# Patient Record
Sex: Male | Born: 1990 | Race: Black or African American | Hispanic: No | Marital: Single | Smoking: Never smoker
Health system: Southern US, Community
[De-identification: ages and names within clinical notes are randomized; demographics above are authoritative.]

---
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
## 2010-05-08 ENCOUNTER — Emergency Department (HOSPITAL_COMMUNITY)
Admission: EM | Admit: 2010-05-08 | Discharge: 2010-05-08 | Payer: Self-pay | Source: Home / Self Care | Admitting: Emergency Medicine

## 2010-09-28 ENCOUNTER — Emergency Department (HOSPITAL_COMMUNITY)
Admission: EM | Admit: 2010-09-28 | Discharge: 2010-09-28 | Disposition: A | Discharge status: Home / Self Care | Payer: No Typology Code available for payment source | Attending: Emergency Medicine | Admitting: Emergency Medicine

## 2010-09-28 DIAGNOSIS — M545 Low back pain, unspecified: Secondary | ICD-10-CM | POA: Insufficient documentation

## 2010-09-28 DIAGNOSIS — S335XXA Sprain of ligaments of lumbar spine, initial encounter: Secondary | ICD-10-CM | POA: Insufficient documentation

## 2010-09-28 DIAGNOSIS — R51 Headache: Secondary | ICD-10-CM | POA: Insufficient documentation

## 2011-10-20 ENCOUNTER — Encounter (HOSPITAL_COMMUNITY): Payer: Self-pay

## 2011-10-20 ENCOUNTER — Emergency Department (HOSPITAL_COMMUNITY): Payer: No Typology Code available for payment source

## 2011-10-20 ENCOUNTER — Emergency Department (HOSPITAL_COMMUNITY)
Admission: EM | Admit: 2011-10-20 | Discharge: 2011-10-20 | Disposition: A | Discharge status: Home / Self Care | Payer: No Typology Code available for payment source | Attending: Emergency Medicine | Admitting: Emergency Medicine

## 2011-10-20 DIAGNOSIS — M545 Low back pain, unspecified: Secondary | ICD-10-CM | POA: Insufficient documentation

## 2011-10-20 DIAGNOSIS — S39012A Strain of muscle, fascia and tendon of lower back, initial encounter: Secondary | ICD-10-CM

## 2011-10-20 DIAGNOSIS — S335XXA Sprain of ligaments of lumbar spine, initial encounter: Secondary | ICD-10-CM | POA: Insufficient documentation

## 2011-10-20 MED ORDER — HYDROCODONE-ACETAMINOPHEN 5-325 MG PO TABS: 1.0000 | ORAL_TABLET | ORAL | Status: AC | PRN

## 2011-10-20 MED ORDER — HYDROCODONE-ACETAMINOPHEN 5-325 MG PO TABS
1.0000 | ORAL_TABLET | Freq: Once | ORAL | Status: AC
  Administered 2011-10-20: 1 via ORAL
  Filled 2011-10-20: qty 1

## 2011-10-20 MED ORDER — CYCLOBENZAPRINE HCL 10 MG PO TABS: 10.0000 mg | ORAL_TABLET | Freq: Two times a day (BID) | ORAL | Status: AC | PRN

## 2011-10-20 NOTE — ED Provider Notes (Signed)
Medical screening examination/treatment/procedure(s) were performed by non-physician practitioner and as supervising physician I was immediately available for consultation/collaboration.  Juliet Rude. Rubin Payor, MD 10/20/11 2325
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
## 2011-10-20 NOTE — Discharge Instructions (Signed)
Lumbosacral Strain Lumbosacral strain is one of the most common causes of back pain. There are many causes of back pain. Most are not serious conditions. CAUSES  Your backbone (spinal column) is made up of 24 main vertebral bodies, the sacrum, and the coccyx. These are held together by muscles and tough, fibrous tissue (ligaments). Nerve roots pass through the openings between the vertebrae. A sudden move or injury to the back may cause injury to, or pressure on, these nerves. This may result in localized back pain or pain movement (radiation) into the buttocks, down the leg, and into the foot. Sharp, shooting pain from the buttock down the back of the leg (sciatica) is frequently associated with a ruptured (herniated) disk. Pain may be caused by muscle spasm alone. Your caregiver can often find the cause of your pain by the details of your symptoms and an exam. In some cases, you may need tests (such as X-rays). Your caregiver will work with you to decide if any tests are needed based on your specific exam. HOME CARE INSTRUCTIONS   Avoid an underactive lifestyle. Active exercise, as directed by your caregiver, is your greatest weapon against back pain.   Avoid hard physical activities (tennis, racquetball, waterskiing) if you are not in proper physical condition for it. This may aggravate or create problems.   If you have a back problem, avoid sports requiring sudden body movements. Swimming and walking are generally safer activities.   Maintain good posture.   Avoid becoming overweight (obese).   Use bed rest for only the most extreme, sudden (acute) episode. Your caregiver will help you determine how much bed rest is necessary.   For acute conditions, you may put ice on the injured area.   Put ice in a plastic bag.   Place a towel between your skin and the bag.   Leave the ice on for 15 to 20 minutes at a time, every 2 hours, or as needed.   After you are improved and more active, it  may help to apply heat for 30 minutes before activities.  See your caregiver if you are having pain that lasts longer than expected. Your caregiver can advise appropriate exercises or therapy if needed. With conditioning, most back problems can be avoided. SEEK IMMEDIATE MEDICAL CARE IF:   You have numbness, tingling, weakness, or problems with the use of your arms or legs.   You experience severe back pain not relieved with medicines.   There is a change in bowel or bladder control.   You have increasing pain in any area of the body, including your belly (abdomen).   You notice shortness of breath, dizziness, or feel faint.   You feel sick to your stomach (nauseous), are throwing up (vomiting), or become sweaty.   You notice discoloration of your toes or legs, or your feet get very cold.   Your back pain is getting worse.   You have a fever.  MAKE SURE YOU:   Understand these instructions.   Will watch your condition.   Will get help right away if you are not doing well or get worse.  Document Released: 02/25/2005 Document Revised: 05/07/2011 Document Reviewed: 08/17/2008 [facility] Patient Information 2012 [place], [place].Muscle Strain A muscle strain (pulled muscle) happens when a muscle is over-stretched. Recovery usually takes 5 to 6 weeks.  HOME CARE   Put ice on the injured area.   Put ice in a plastic bag.   Place a towel between your skin and  the bag.   Leave the ice on for 15 to 20 minutes at a time, every hour for the first 2 days.   Do not use the muscle for several days or until your doctor says you can. Do not use the muscle if you have pain.   Wrap the injured area with an elastic bandage for comfort. Do not put it on too tightly.   Only take medicine as told by your doctor.   Warm up before exercise. This helps prevent muscle strains.  GET HELP RIGHT AWAY IF:  There is increased pain or puffiness (swelling) in the affected area. MAKE SURE YOU:    Understand these instructions.   Will watch your condition.   Will get help right away if you are not doing well or get worse.  Document Released: 02/25/2008 Document Revised: 05/07/2011 Document Reviewed: 02/25/2008 [facility] Patient Information 2012 [place], [place].
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
## 2014-03-19 ENCOUNTER — Emergency Department (HOSPITAL_COMMUNITY)
Admission: EM | Admit: 2014-03-19 | Discharge: 2014-03-19 | Disposition: A | Discharge status: Home / Self Care | Payer: No Typology Code available for payment source | Attending: Emergency Medicine | Admitting: Emergency Medicine

## 2014-03-19 ENCOUNTER — Encounter (HOSPITAL_COMMUNITY): Payer: Self-pay | Admitting: Emergency Medicine

## 2014-03-19 DIAGNOSIS — Y9389 Activity, other specified: Secondary | ICD-10-CM | POA: Insufficient documentation

## 2014-03-19 DIAGNOSIS — Y92410 Unspecified street and highway as the place of occurrence of the external cause: Secondary | ICD-10-CM | POA: Insufficient documentation

## 2014-03-19 DIAGNOSIS — G44209 Tension-type headache, unspecified, not intractable: Secondary | ICD-10-CM

## 2014-03-19 DIAGNOSIS — S0990XA Unspecified injury of head, initial encounter: Secondary | ICD-10-CM | POA: Insufficient documentation

## 2014-03-19 DIAGNOSIS — S161XXA Strain of muscle, fascia and tendon at neck level, initial encounter: Secondary | ICD-10-CM

## 2014-03-19 DIAGNOSIS — S39012A Strain of muscle, fascia and tendon of lower back, initial encounter: Secondary | ICD-10-CM | POA: Insufficient documentation

## 2014-03-19 MED ORDER — DIAZEPAM 5 MG PO TABS: 5.0000 mg | ORAL_TABLET | Freq: Two times a day (BID) | ORAL | Status: DC

## 2014-03-19 MED ORDER — NAPROXEN 500 MG PO TABS
500.0000 mg | ORAL_TABLET | Freq: Once | ORAL | Status: AC
  Administered 2014-03-19: 500 mg via ORAL
  Filled 2014-03-19: qty 1

## 2014-03-19 MED ORDER — NAPROXEN 500 MG PO TABS
500.0000 mg | ORAL_TABLET | Freq: Two times a day (BID) | ORAL | Status: DC
Start: 2014-03-19 — End: 2020-05-25

## 2014-03-19 NOTE — Discharge Instructions (Signed)
Muscle Strain °A muscle strain is an injury that occurs when a muscle is stretched beyond its normal length. Usually a small number of muscle fibers are torn when this happens. Muscle strain is rated in degrees. First-degree strains have the least amount of muscle fiber tearing and pain. Second-degree and third-degree strains have increasingly more tearing and pain.  °Usually, recovery from muscle strain takes 1-2 weeks. Complete healing takes 5-6 weeks.  °CAUSES  °Muscle strain happens when a sudden, violent force placed on a muscle stretches it too far. This may occur with lifting, sports, or a fall.  °RISK FACTORS °Muscle strain is especially common in athletes.  °SIGNS AND SYMPTOMS °At the site of the muscle strain, there may be: °· Pain. °· Bruising. °· Swelling. °· Difficulty using the muscle due to pain or lack of normal function. °DIAGNOSIS  °Your health care provider will perform a physical exam and ask about your medical history. °TREATMENT  °Often, the best treatment for a muscle strain is resting, icing, and applying cold compresses to the injured area.   °HOME CARE INSTRUCTIONS  °· Use the PRICE method of treatment to promote muscle healing during the first 2-3 days after your injury. The PRICE method involves: °· Protecting the muscle from being injured again. °· Restricting your activity and resting the injured body part. °· Icing your injury. To do this, put ice in a plastic bag. Place a towel between your skin and the bag. Then, apply the ice and leave it on from 15-20 minutes each hour. After the third day, switch to moist heat packs. °· Apply compression to the injured area with a splint or elastic bandage. Be careful not to wrap it too tightly. This may interfere with blood circulation or increase swelling. °· Elevate the injured body part above the level of your heart as often as you can. °· Only take over-the-counter or prescription medicines for pain, discomfort, or fever as directed by your  health care provider. °· Warming up prior to exercise helps to prevent future muscle strains. °SEEK MEDICAL CARE IF:  °· You have increasing pain or swelling in the injured area. °· You have numbness, tingling, or a significant loss of strength in the injured area. °MAKE SURE YOU:  °· Understand these instructions. °· Will watch your condition. °· Will get help right away if you are not doing well or get worse. °Document Released: 05/18/2005 Document Revised: 03/08/2013 Document Reviewed: 12/15/2012 °[employer]® Patient Information ©2015 [employer]. This information is not intended to replace advice given to you by your health care provider. Make sure you discuss any questions you have with your health care provider. ° °Motor Vehicle Collision °It is common to have multiple bruises and sore muscles after a motor vehicle collision (MVC). These tend to feel worse for the first 24 hours. You may have the most stiffness and soreness over the first several hours. You may also feel worse when you wake up the first morning after your collision. After this point, you will usually begin to improve with each day. The speed of improvement often depends on the severity of the collision, the number of injuries, and the location and nature of these injuries. °HOME CARE INSTRUCTIONS °· Put ice on the injured area. °¨ Put ice in a plastic bag. °¨ Place a towel between your skin and the bag. °¨ Leave the ice on for 15-20 minutes, 3-4 times a day, or as directed by your health care provider. °· Drink enough fluids to   keep your urine clear or pale yellow. Do not drink alcohol. °· Take a warm shower or bath once or twice a day. This will increase blood flow to sore muscles. °· You may return to activities as directed by your caregiver. Be careful when lifting, as this may aggravate neck or back pain. °· Only take over-the-counter or prescription medicines for pain, discomfort, or fever as directed by your caregiver. Do not use  aspirin. This may increase bruising and bleeding. °SEEK IMMEDIATE MEDICAL CARE IF: °· You have numbness, tingling, or weakness in the arms or legs. °· You develop severe headaches not relieved with medicine. °· You have severe neck pain, especially tenderness in the middle of the back of your neck. °· You have changes in bowel or bladder control. °· There is increasing pain in any area of the body. °· You have shortness of breath, light-headedness, dizziness, or fainting. °· You have chest pain. °· You feel sick to your stomach (nauseous), throw up (vomit), or sweat. °· You have increasing abdominal discomfort. °· There is blood in your urine, stool, or vomit. °· You have pain in your shoulder (shoulder strap areas). °· You feel your symptoms are getting worse. °MAKE SURE YOU: °· Understand these instructions. °· Will watch your condition. °· Will get help right away if you are not doing well or get worse. °Document Released: 05/18/2005 Document Revised: 10/02/2013 Document Reviewed: 10/15/2010 °[employer]® Patient Information ©2015 [employer]. This information is not intended to replace advice given to you by your health care provider. Make sure you discuss any questions you have with your health care provider. ° °
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
## 2014-03-19 NOTE — ED Provider Notes (Signed)
Medical screening examination/treatment/procedure(s) were performed by non-physician practitioner and as supervising physician I was immediately available for consultation/collaboration.  Tache Bobst T Infant Doane, MD 03/19/14 2353 

## 2014-09-09 ENCOUNTER — Encounter (HOSPITAL_COMMUNITY): Payer: Self-pay | Admitting: Emergency Medicine

## 2014-09-09 ENCOUNTER — Emergency Department (HOSPITAL_COMMUNITY)
Admission: EM | Admit: 2014-09-09 | Discharge: 2014-09-09 | Disposition: A | Discharge status: Home / Self Care | Payer: Self-pay | Attending: Emergency Medicine | Admitting: Emergency Medicine

## 2014-09-09 DIAGNOSIS — Z791 Long term (current) use of non-steroidal anti-inflammatories (NSAID): Secondary | ICD-10-CM | POA: Insufficient documentation

## 2014-09-09 DIAGNOSIS — S0990XA Unspecified injury of head, initial encounter: Secondary | ICD-10-CM | POA: Insufficient documentation

## 2014-09-09 DIAGNOSIS — Y92410 Unspecified street and highway as the place of occurrence of the external cause: Secondary | ICD-10-CM | POA: Insufficient documentation

## 2014-09-09 DIAGNOSIS — Y998 Other external cause status: Secondary | ICD-10-CM | POA: Insufficient documentation

## 2014-09-09 DIAGNOSIS — R51 Headache: Secondary | ICD-10-CM

## 2014-09-09 DIAGNOSIS — Y9389 Activity, other specified: Secondary | ICD-10-CM | POA: Insufficient documentation

## 2014-09-09 DIAGNOSIS — Z79899 Other long term (current) drug therapy: Secondary | ICD-10-CM | POA: Insufficient documentation

## 2014-09-09 DIAGNOSIS — R519 Headache, unspecified: Secondary | ICD-10-CM

## 2014-09-09 MED ORDER — CYCLOBENZAPRINE HCL 10 MG PO TABS
10.0000 mg | ORAL_TABLET | Freq: Once | ORAL | Status: AC
  Administered 2014-09-09: 10 mg via ORAL
  Filled 2014-09-09: qty 1

## 2014-09-09 MED ORDER — TRAMADOL HCL 50 MG PO TABS
50.0000 mg | ORAL_TABLET | Freq: Once | ORAL | Status: AC
  Administered 2014-09-09: 50 mg via ORAL
  Filled 2014-09-09: qty 1

## 2014-09-09 MED ORDER — TRAMADOL HCL 50 MG PO TABS: 50.0000 mg | ORAL_TABLET | Freq: Four times a day (QID) | ORAL | Status: DC | PRN

## 2014-09-09 MED ORDER — CYCLOBENZAPRINE HCL 10 MG PO TABS: 10.0000 mg | ORAL_TABLET | Freq: Two times a day (BID) | ORAL | Status: DC | PRN
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
## 2014-09-09 NOTE — Discharge Instructions (Signed)
Take Tramadol as needed for pain. Take Flexeril as needed for muscle spasm. Refer to attached documents for more information.  °

## 2020-05-25 ENCOUNTER — Emergency Department (HOSPITAL_COMMUNITY)
Admission: EM | Admit: 2020-05-25 | Discharge: 2020-05-25 | Disposition: A | Discharge status: Home / Self Care | Payer: Self-pay | Attending: Emergency Medicine | Admitting: Emergency Medicine

## 2020-05-25 ENCOUNTER — Encounter (HOSPITAL_COMMUNITY): Payer: Self-pay

## 2020-05-25 ENCOUNTER — Other Ambulatory Visit: Payer: Self-pay

## 2020-05-25 DIAGNOSIS — Z20822 Contact with and (suspected) exposure to covid-19: Secondary | ICD-10-CM | POA: Insufficient documentation

## 2020-05-25 DIAGNOSIS — M5441 Lumbago with sciatica, right side: Secondary | ICD-10-CM | POA: Insufficient documentation

## 2020-05-25 DIAGNOSIS — J029 Acute pharyngitis, unspecified: Secondary | ICD-10-CM | POA: Insufficient documentation

## 2020-05-25 LAB — URINALYSIS, ROUTINE W REFLEX MICROSCOPIC
Bacteria, UA: NONE SEEN
Bilirubin Urine: NEGATIVE
Glucose, UA: NEGATIVE mg/dL
Ketones, ur: NEGATIVE mg/dL
Leukocytes,Ua: NEGATIVE
Nitrite: NEGATIVE
Protein, ur: 30 mg/dL — AB
Specific Gravity, Urine: 1.014 (ref 1.005–1.030)
pH: 5 (ref 5.0–8.0)

## 2020-05-25 LAB — POC SARS CORONAVIRUS 2 AG -  ED: SARS Coronavirus 2 Ag: NEGATIVE

## 2020-05-25 LAB — GROUP A STREP BY PCR: Group A Strep by PCR: NOT DETECTED

## 2020-05-25 MED ORDER — ACETAMINOPHEN 500 MG PO TABS
1000.0000 mg | ORAL_TABLET | Freq: Once | ORAL | Status: AC
  Administered 2020-05-25: 1000 mg via ORAL
  Filled 2020-05-25: qty 2

## 2020-05-25 MED ORDER — KETOROLAC TROMETHAMINE 15 MG/ML IJ SOLN
15.0000 mg | Freq: Once | INTRAMUSCULAR | Status: AC
  Administered 2020-05-25: 15 mg via INTRAMUSCULAR
  Filled 2020-05-25: qty 1

## 2020-05-25 MED ORDER — CYCLOBENZAPRINE HCL 10 MG PO TABS: 10.0000 mg | ORAL_TABLET | Freq: Two times a day (BID) | ORAL | 0 refills | Status: AC | PRN

## 2020-05-25 MED ORDER — PREDNISONE 20 MG PO TABS: 40.0000 mg | ORAL_TABLET | Freq: Every day | ORAL | 0 refills | Status: AC

## 2020-05-25 MED ORDER — DIAZEPAM 5 MG PO TABS
5.0000 mg | ORAL_TABLET | Freq: Once | ORAL | Status: AC
  Administered 2020-05-25: 5 mg via ORAL
  Filled 2020-05-25: qty 1

## 2020-05-25 MED ORDER — OXYCODONE HCL 5 MG PO TABS
5.0000 mg | ORAL_TABLET | Freq: Once | ORAL | Status: AC
  Administered 2020-05-25: 5 mg via ORAL
  Filled 2020-05-25: qty 1

## 2020-05-25 NOTE — ED Triage Notes (Signed)
Pt reports 3 days of right sided lower back pain that sometimes radiates down his leg, pain is worse when he is sitting for a long period of time. Pt also reports a sore throat, hx of tonsillitis. Denies fever but endorses intermittent chills.

## 2020-05-25 NOTE — Discharge Instructions (Addendum)
Your Covid 19 test today was negative.  You may continue to treat your fever at home with Tylenol.  I have prescribed a short course of muscle relaxers to help with your back spasms.  I have also prescribed a short course of steroids to help with your pain.  Please be aware this medication can cause flushness, changes in appetite, insomnia.  Please be aware muscle relaxers can also cause you to be drowsy, do not drive while taking this medication.
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
## 2022-11-28 ENCOUNTER — Emergency Department (HOSPITAL_COMMUNITY)
Admission: EM | Admit: 2022-11-28 | Discharge: 2022-11-28 | Disposition: A | Discharge status: Home / Self Care | Payer: Self-pay | Attending: Emergency Medicine | Admitting: Emergency Medicine

## 2022-11-28 ENCOUNTER — Other Ambulatory Visit: Payer: Self-pay

## 2022-11-28 DIAGNOSIS — R519 Headache, unspecified: Secondary | ICD-10-CM | POA: Insufficient documentation

## 2022-11-28 DIAGNOSIS — Y92410 Unspecified street and highway as the place of occurrence of the external cause: Secondary | ICD-10-CM | POA: Insufficient documentation

## 2022-11-28 DIAGNOSIS — M549 Dorsalgia, unspecified: Secondary | ICD-10-CM | POA: Diagnosis present

## 2022-11-28 DIAGNOSIS — M546 Pain in thoracic spine: Secondary | ICD-10-CM | POA: Diagnosis not present

## 2022-11-28 DIAGNOSIS — S39012A Strain of muscle, fascia and tendon of lower back, initial encounter: Secondary | ICD-10-CM | POA: Insufficient documentation

## 2022-11-28 MED ORDER — ETODOLAC 400 MG PO TABS: 400.0000 mg | ORAL_TABLET | Freq: Two times a day (BID) | ORAL | 0 refills | Status: DC

## 2022-11-28 MED ORDER — METHOCARBAMOL 500 MG PO TABS: 500.0000 mg | ORAL_TABLET | Freq: Two times a day (BID) | ORAL | 0 refills | Status: AC

## 2022-11-28 MED ORDER — ETODOLAC 400 MG PO TABS: 400.0000 mg | ORAL_TABLET | Freq: Two times a day (BID) | ORAL | 0 refills | Status: AC

## 2022-11-28 MED ORDER — LIDOCAINE 5 % EX PTCH: 1.0000 | MEDICATED_PATCH | CUTANEOUS | 0 refills | Status: DC

## 2022-11-28 MED ORDER — METHOCARBAMOL 500 MG PO TABS: 500.0000 mg | ORAL_TABLET | Freq: Two times a day (BID) | ORAL | 0 refills | Status: DC

## 2022-11-28 MED ORDER — LIDOCAINE 5 % EX PTCH: 1.0000 | MEDICATED_PATCH | CUTANEOUS | 0 refills | Status: AC
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
## 2022-11-28 NOTE — Discharge Instructions (Addendum)
Your exam today is reassuring.  Your back pain is likely associated with muscle strain.  I have sent a few medications into the pharmacy for you.  If you have any concerning symptoms return to the emergency room for evaluation.  Otherwise follow-up with your primary care provider.

## 2023-12-30 ENCOUNTER — Encounter (HOSPITAL_COMMUNITY): Payer: Self-pay | Admitting: Emergency Medicine

## 2023-12-30 ENCOUNTER — Other Ambulatory Visit: Payer: Self-pay

## 2023-12-30 ENCOUNTER — Emergency Department (HOSPITAL_COMMUNITY)
Admission: EM | Admit: 2023-12-30 | Discharge: 2023-12-31 | Disposition: A | Discharge status: Home / Self Care | Attending: Student | Admitting: Student

## 2023-12-30 DIAGNOSIS — D72829 Elevated white blood cell count, unspecified: Secondary | ICD-10-CM | POA: Insufficient documentation

## 2023-12-30 DIAGNOSIS — R42 Dizziness and giddiness: Secondary | ICD-10-CM | POA: Diagnosis not present

## 2023-12-30 DIAGNOSIS — R1084 Generalized abdominal pain: Secondary | ICD-10-CM | POA: Diagnosis not present

## 2023-12-30 DIAGNOSIS — R112 Nausea with vomiting, unspecified: Secondary | ICD-10-CM | POA: Diagnosis present

## 2023-12-30 LAB — LIPASE, BLOOD: Lipase: 44 U/L (ref 11–51)

## 2023-12-30 LAB — CBC
HCT: 46.4 % (ref 39.0–52.0)
Hemoglobin: 15.4 g/dL (ref 13.0–17.0)
MCH: 30.1 pg (ref 26.0–34.0)
MCHC: 33.2 g/dL (ref 30.0–36.0)
MCV: 90.8 fL (ref 80.0–100.0)
Platelets: 345 K/uL (ref 150–400)
RBC: 5.11 MIL/uL (ref 4.22–5.81)
RDW: 12.9 % (ref 11.5–15.5)
WBC: 14.3 K/uL — ABNORMAL HIGH (ref 4.0–10.5)
nRBC: 0 % (ref 0.0–0.2)

## 2023-12-30 LAB — COMPREHENSIVE METABOLIC PANEL WITH GFR
ALT: 41 U/L (ref 0–44)
AST: 23 U/L (ref 15–41)
Albumin: 4.9 g/dL (ref 3.5–5.0)
Alkaline Phosphatase: 90 U/L (ref 38–126)
Anion gap: 13 (ref 5–15)
BUN: 12 mg/dL (ref 6–20)
CO2: 22 mmol/L (ref 22–32)
Calcium: 10.4 mg/dL — ABNORMAL HIGH (ref 8.9–10.3)
Chloride: 105 mmol/L (ref 98–111)
Creatinine, Ser: 1.05 mg/dL (ref 0.61–1.24)
GFR, Estimated: >60 mL/min (ref >60)
Glucose, Bld: 137 mg/dL — ABNORMAL HIGH (ref 70–99)
Potassium: 3.7 mmol/L (ref 3.5–5.1)
Sodium: 140 mmol/L (ref 135–145)
Total Bilirubin: 1.2 mg/dL (ref 0.0–1.2)
Total Protein: 8.9 g/dL — ABNORMAL HIGH (ref 6.5–8.1)

## 2023-12-30 MED ORDER — MORPHINE SULFATE (PF) 4 MG/ML IV SOLN
4.0000 mg | Freq: Once | INTRAVENOUS | Status: AC
  Administered 2023-12-30: 4 mg via INTRAVENOUS
  Filled 2023-12-30: qty 1

## 2023-12-30 MED ORDER — ONDANSETRON HCL 4 MG/2ML IJ SOLN
4.0000 mg | Freq: Once | INTRAMUSCULAR | Status: AC
Start: 2023-12-30 — End: 2023-12-30
  Administered 2023-12-30: 4 mg via INTRAVENOUS
  Filled 2023-12-30: qty 2

## 2023-12-30 MED ORDER — SODIUM CHLORIDE 0.9 % IV BOLUS
1000.0000 mL | Freq: Once | INTRAVENOUS | Status: AC
  Administered 2023-12-30: 1000 mL via INTRAVENOUS

## 2023-12-30 NOTE — ED Triage Notes (Signed)
 Pt reports abd pain, n/v since this am, pt reports he is on wegovy x 3 mos, last dose change was 1 mo ago

## 2023-12-30 NOTE — ED Provider Notes (Signed)
 [place] EMERGENCY DEPARTMENT AT [facility] Provider Note   CSN: [id number] Arrival date & time: 12/30/23  8063     Patient presents with: Abdominal Pain   Noah Navarro is a 33 y.o. male.   The history is provided by the patient and medical records. No language interpreter was used.  Abdominal Pain    33 year old male presenting with complaints of abdominal pain.  Patient states's throughout the day today he is experiencing abdominal cramping with severe nausea and vomiting which makes him feel very uncomfortable.  He felt lightheadedness as well.  He states he ate [employer] yesterday and unsure if that may have contributed to his condition.  He also admits to using semaglutide injection for weight loss and having using it for the past 3 months.  He is scheduled to have his dose increased tomorrow.  Patient also admits to regular daily marijuana use.  He denies alcohol abuse denies any chest pain or shortness of breath.  Last bowel movement was earlier today.  No urinary complaint.  Prior to Admission medications   Medication Sig Start Date End Date Taking? Authorizing Provider  Chlorphen-Phenyleph-Ibuprofen (ADVIL MULTI-SYMPTOM COLD & FLU PO) Take 1 tablet by mouth every 4 (four) hours as needed (cough/congestion/fever).    [provider]  Chlorphen-Pseudoephed-APAP (THERAFLU FLU/COLD PO) Take 1 packet by mouth every 4 (four) hours as needed (cough/congestion/fever).    [provider]  etodolac  (LODINE ) 400 MG tablet Take 1 tablet (400 mg total) by mouth 2 (two) times daily. 11/28/22   Hildegard Loge, PA-C  guaifenesin (ROBITUSSIN) 100 MG/5ML syrup Take 200 mg by mouth 3 (three) times daily as needed for cough.    [provider]  lidocaine  (LIDODERM ) 5 % Place 1 patch onto the skin daily. Remove & Discard patch within 12 hours or as directed by MD 11/28/22   Hildegard Loge, PA-C  methocarbamol  (ROBAXIN ) 500 MG tablet Take 1 tablet (500 mg  total) by mouth 2 (two) times daily. 11/28/22   Hildegard Loge, PA-C    Allergies: Patient has no known allergies.    Review of Systems  Gastrointestinal:  Positive for abdominal pain.  All other systems reviewed and are negative.   Updated Vital Signs BP 131/82 (BP Location: Left Arm)   Pulse 96   Temp 98.3 F (36.8 C) (Oral)   Resp 17   Ht 6' 2 (1.88 m)   Wt 122.5 kg   SpO2 99%   BMI 34.67 kg/m   Physical Exam Vitals and nursing note reviewed.  Constitutional:      General: He is not in acute distress.    Appearance: He is well-developed. He is obese.  HENT:     Head: Atraumatic.  Eyes:     Conjunctiva/sclera: Conjunctivae normal.  Cardiovascular:     Rate and Rhythm: Normal rate and regular rhythm.  Pulmonary:     Effort: Pulmonary effort is normal.     Breath sounds: Normal breath sounds.  Abdominal:     General: Bowel sounds are normal.     Palpations: Abdomen is soft.     Tenderness: There is generalized abdominal tenderness.     Hernia: No hernia is present.  Musculoskeletal:     Cervical back: Neck supple.  Skin:    Findings: No rash.  Neurological:     Mental Status: He is alert.     (all labs ordered are listed, but only abnormal results are displayed) Labs Reviewed  COMPREHENSIVE METABOLIC PANEL  WITH GFR - Abnormal; Notable for the following components:      Result Value   Glucose, Bld 137 (*)    Calcium 10.4 (*)    Total Protein 8.9 (*)    All other components within normal limits  CBC - Abnormal; Notable for the following components:   WBC 14.3 (*)    All other components within normal limits  LIPASE, BLOOD  URINALYSIS, ROUTINE W REFLEX MICROSCOPIC    EKG: None ED ECG REPORT   Date: 12/30/2023  Rate: 58  Rhythm: normal sinus rhythm  QRS Axis: left  Intervals: normal  ST/T Wave abnormalities: normal  Conduction Disutrbances:none  Narrative Interpretation:   Old EKG Reviewed: unchanged  I have personally reviewed the EKG tracing  and agree with the computerized printout as noted.   Radiology: No results found.   Procedures   Medications Ordered in the ED - No data to display  Clinical Course as of 12/30/23 2354  Thu Dec 30, 2023  2345 Ab pain, N/V. Pain began today. CT scan for leukocytosis. No Hx of CHS. Smokes daily. CT scan if normal, dc home. No mounjaro dose increases. No fever, some loose stools. F/u on CT, control symptoms, dispo [CG]    Clinical Course User Index [CG] Ruthell Lonni FALCON, PA-C                                 Medical Decision Making Amount and/or Complexity of Data Reviewed Labs: ordered. Radiology: ordered. ECG/medicine tests: ordered.  Risk Prescription drug management.   BP 131/82 (BP Location: Left Arm)   Pulse 96   Temp 98.3 F (36.8 C) (Oral)   Resp 17   Ht 6' 2 (1.88 m)   Wt 122.5 kg   SpO2 99%   BMI 34.67 kg/m   24:62 PM  33 year old male presenting with complaints of abdominal pain.  Patient states's throughout the day today he is experiencing abdominal cramping with severe nausea and vomiting which makes him feel very uncomfortable.  He felt lightheadedness as well.  He states he ate [employer] yesterday and unsure if that may have contributed to his condition.  He also admits to using semaglutide injection for weight loss and having using it for the past 3 months.  He is scheduled to have his dose increased tomorrow.  Patient also admits to regular daily marijuana use.  He denies alcohol abuse denies any chest pain or shortness of breath.  Last bowel movement was earlier today.  No urinary complaint.  On exam patient is resting comfortably appears to be in no acute discomfort.  He has some generalized abdominal tenderness without focal point tenderness.  Has normal skin turgor, heart with normal rate and rhythm, lungs are clear.  Bowel sounds are present.  -Labs ordered, independently viewed and interpreted by me.  Labs remarkable for WBC 14.3 -The  patient was maintained on a cardiac monitor.  I personally viewed and interpreted the cardiac monitored which showed an underlying rhythm of: sinus brady -Imaging including abd/pelvis CT scan is currently pending.  -This patient presents to the ED for concern of abd pain, this involves an extensive number of treatment options, and is a complaint that carries with it a high risk of complications and morbidity.  The differential diagnosis includes CHS, gastritis, GERD, appendicitis, colitis, pancreatitis, cholecystitis -Co morbidities that complicate the patient evaluation includes marijuana use -Treatment includes morphine , zofran , IVF -Reevaluation of  the patient after these medicines showed that the patient improved -PCP office notes or outside notes reviewed -Discussion with oncoming team who will f/u on CT result and determine disposition -Escalation to admission/observation considered: dispo pending      Final diagnoses:  None    ED Discharge Orders     None          Nivia Colon, PA-C 12/30/23 2354    Albertina Dixon, MD 12/31/23 1413

## 2023-12-31 ENCOUNTER — Emergency Department (HOSPITAL_COMMUNITY)

## 2023-12-31 MED ORDER — IOHEXOL 300 MG/ML  SOLN
100.0000 mL | Freq: Once | INTRAMUSCULAR | Status: AC | PRN
  Administered 2023-12-31: 100 mL via INTRAVENOUS

## 2023-12-31 MED ORDER — ONDANSETRON HCL 4 MG PO TABS: 4.0000 mg | ORAL_TABLET | Freq: Four times a day (QID) | ORAL | 0 refills | Status: DC

## 2023-12-31 NOTE — Discharge Instructions (Addendum)
 It was a pleasure taking part in your care.  As we discussed, please continue with your healthy diet.  Please wait 1 week before increasing dose of Mounjaro.  Please discuss this with your PCP, prescriber of Mounjaro.  Please return to the ED with any new symptoms.  I prescribed Zofran .  Take this every 6 hours for nausea.

## 2023-12-31 NOTE — ED Provider Notes (Signed)
  Physical Exam  BP (!) 154/79   Pulse 79   Temp 98.4 F (36.9 C)   Resp 18   Ht 6' 2 (1.88 m)   Wt 122.5 kg   SpO2 100%   BMI 34.67 kg/m   Physical Exam Vitals and nursing note reviewed.  Constitutional:      General: He is not in acute distress.    Appearance: He is not toxic-appearing.  HENT:     Head: Normocephalic and atraumatic.  Pulmonary:     Effort: No respiratory distress.  Skin:    Coloration: Skin is not jaundiced or pale.  Neurological:     Mental Status: He is alert and oriented to person, place, and time.  Psychiatric:        Behavior: Behavior normal.     Procedures  Procedures  ED Course / MDM   Clinical Course as of 12/31/23 0316  Thu Dec 30, 2023  2345 Ab pain, N/V. Pain began today. CT scan for leukocytosis. No Hx of CHS. Smokes daily. CT scan if normal, dc home. No mounjaro dose increases. No fever, some loose stools. F/u on CT, control symptoms, dispo [CG]  Fri Dec 31, 2023  0101 Reassessed.  Reports symptoms are markedly improved.  No nausea or vomiting at this time.  Does want CT scan.  Dispo pending CT scan. [CG]    Clinical Course User Index [CG] Ruthell Lonni FALCON, PA-C   Medical Decision Making Amount and/or Complexity of Data Reviewed Labs: ordered. Radiology: ordered. ECG/medicine tests: ordered.  Risk Prescription drug management.   Patient signed out to me at shift change.  Please see previous provider note for further details, full HPI.  In short, 33 year old male with abdominal pain, nausea and vomiting beginning today.  Lab work reveals leukocytosis which is nonspecific however.  His provider was concerned so CT scan was ordered.  Patient reports he smokes daily, has done this for over 10 years.  Never had any negative side effects.  States that he is set to increase Mounjaro dose tomorrow.  Reports he has been on this dose of Mounjaro for 2 months without issues.  States that yesterday he had a cheat meal, 2 slices of  pizza from [employer], which preceded his symptoms.  Patient's CT scan here unremarkable.  No acute cause identified.  Patient was counseled that his presentation could be secondary to [place] so I have advised him to discontinue marijuana.  Also advised him to decrease secondary to having a cheat meal so I advised him to continue eating healthy diet due to the fact he is on Mounjaro.  He voiced understanding.  He is set to follow-up with his PCP in the morning.  Stable to discharge.  Return precautions given.       Ruthell Lonni FALCON, PA-C 12/31/23 0316    Griselda Norris, MD 12/31/23 [phone]

## 2024-04-14 ENCOUNTER — Other Ambulatory Visit: Payer: Self-pay

## 2024-04-14 ENCOUNTER — Emergency Department (HOSPITAL_BASED_OUTPATIENT_CLINIC_OR_DEPARTMENT_OTHER)

## 2024-04-14 ENCOUNTER — Emergency Department (HOSPITAL_BASED_OUTPATIENT_CLINIC_OR_DEPARTMENT_OTHER)
Admission: EM | Admit: 2024-04-14 | Discharge: 2024-04-14 | Disposition: A | Discharge status: Home / Self Care | Attending: Emergency Medicine | Admitting: Emergency Medicine

## 2024-04-14 DIAGNOSIS — R112 Nausea with vomiting, unspecified: Secondary | ICD-10-CM | POA: Insufficient documentation

## 2024-04-14 DIAGNOSIS — R1084 Generalized abdominal pain: Secondary | ICD-10-CM | POA: Insufficient documentation

## 2024-04-14 DIAGNOSIS — R197 Diarrhea, unspecified: Secondary | ICD-10-CM | POA: Diagnosis not present

## 2024-04-14 DIAGNOSIS — R300 Dysuria: Secondary | ICD-10-CM | POA: Insufficient documentation

## 2024-04-14 DIAGNOSIS — D72829 Elevated white blood cell count, unspecified: Secondary | ICD-10-CM | POA: Diagnosis not present

## 2024-04-14 LAB — URINALYSIS, ROUTINE W REFLEX MICROSCOPIC
Bacteria, UA: NONE SEEN
Bilirubin Urine: NEGATIVE
Glucose, UA: NEGATIVE mg/dL
Ketones, ur: 40 mg/dL — AB
Leukocytes,Ua: NEGATIVE
Nitrite: NEGATIVE
Protein, ur: 30 mg/dL — AB
Specific Gravity, Urine: 1.034 — ABNORMAL HIGH (ref 1.005–1.030)
pH: 8.5 — ABNORMAL HIGH (ref 5.0–8.0)

## 2024-04-14 LAB — CBC
HCT: 44 % (ref 39.0–52.0)
Hemoglobin: 15.2 g/dL (ref 13.0–17.0)
MCH: 31.3 pg (ref 26.0–34.0)
MCHC: 34.5 g/dL (ref 30.0–36.0)
MCV: 90.5 fL (ref 80.0–100.0)
Platelets: 343 K/uL (ref 150–400)
RBC: 4.86 MIL/uL (ref 4.22–5.81)
RDW: 12.9 % (ref 11.5–15.5)
WBC: 17.1 K/uL — ABNORMAL HIGH (ref 4.0–10.5)
nRBC: 0 % (ref 0.0–0.2)

## 2024-04-14 LAB — COMPREHENSIVE METABOLIC PANEL WITH GFR
ALT: 19 U/L (ref 0–44)
AST: 14 U/L — ABNORMAL LOW (ref 15–41)
Albumin: 4.9 g/dL (ref 3.5–5.0)
Alkaline Phosphatase: 89 U/L (ref 38–126)
Anion gap: 16 — ABNORMAL HIGH (ref 5–15)
BUN: 11 mg/dL (ref 6–20)
CO2: 23 mmol/L (ref 22–32)
Calcium: 10.4 mg/dL — ABNORMAL HIGH (ref 8.9–10.3)
Chloride: 104 mmol/L (ref 98–111)
Creatinine, Ser: 0.93 mg/dL (ref 0.61–1.24)
GFR, Estimated: >60 mL/min (ref >60)
Glucose, Bld: 128 mg/dL — ABNORMAL HIGH (ref 70–99)
Potassium: 3.9 mmol/L (ref 3.5–5.1)
Sodium: 143 mmol/L (ref 135–145)
Total Bilirubin: 0.7 mg/dL (ref 0.0–1.2)
Total Protein: 7.8 g/dL (ref 6.5–8.1)

## 2024-04-14 LAB — LIPASE, BLOOD: Lipase: 32 U/L (ref 11–51)

## 2024-04-14 MED ORDER — MORPHINE SULFATE (PF) 4 MG/ML IV SOLN
4.0000 mg | Freq: Once | INTRAVENOUS | Status: AC
  Administered 2024-04-14: 4 mg via INTRAVENOUS
  Filled 2024-04-14: qty 1

## 2024-04-14 MED ORDER — ONDANSETRON 4 MG PO TBDP: 4.0000 mg | ORAL_TABLET | Freq: Three times a day (TID) | ORAL | 0 refills | Status: AC | PRN

## 2024-04-14 MED ORDER — ONDANSETRON 4 MG PO TBDP
4.0000 mg | ORAL_TABLET | Freq: Once | ORAL | Status: AC | PRN
  Administered 2024-04-14: 4 mg via ORAL

## 2024-04-14 MED ORDER — SODIUM CHLORIDE 0.9 % IV BOLUS
1000.0000 mL | Freq: Once | INTRAVENOUS | Status: AC
  Administered 2024-04-14: 1000 mL via INTRAVENOUS

## 2024-04-14 MED ORDER — IOHEXOL 300 MG/ML  SOLN
100.0000 mL | Freq: Once | INTRAMUSCULAR | Status: AC | PRN
  Administered 2024-04-14: 100 mL via INTRAVENOUS

## 2024-04-14 MED ORDER — ONDANSETRON HCL 4 MG/2ML IJ SOLN
4.0000 mg | Freq: Once | INTRAMUSCULAR | Status: AC
  Administered 2024-04-14: 4 mg via INTRAVENOUS
  Filled 2024-04-14: qty 2

## 2024-04-14 NOTE — ED Provider Notes (Signed)
 [place] EMERGENCY DEPARTMENT AT [facility] Provider Note   CSN: [id number] Arrival date & time: 04/14/24  1839     Patient presents with: Emesis   Noah Navarro is a 33 y.o. male.   33 year old male presenting with abdominal pain, nausea/vomiting/diarrhea.  Patient notes that symptoms began shortly after he woke up this morning, he had 1 normal bowel movement but then has had additional episodes of diarrhea today, he notes at home this morning he began to have the sweats, no known or documented fever at home.  He has had many episodes of vomiting today, no hematemesis.  Reports generalized abdominal pain that seems to wax and wane, pain is mild at this point.  He is on Wegovy but has not had this medication in 1 week, he was due for a repeat dose today but did not take it due to his symptoms.  Reports mild dysuria, no hematuria.  He had similar symptoms in July of this year, he was never given a definitive cause for his symptoms.  No history of prior abdominal surgeries.  No melena/hematochezia, pain with bowel movements, chest pain or shortness of breath.   Emesis      Prior to Admission medications   Medication Sig Start Date End Date Taking? Authorizing Provider  Chlorphen-Phenyleph-Ibuprofen (ADVIL MULTI-SYMPTOM COLD & FLU PO) Take 1 tablet by mouth every 4 (four) hours as needed (cough/congestion/fever).    [provider]  Chlorphen-Pseudoephed-APAP (THERAFLU FLU/COLD PO) Take 1 packet by mouth every 4 (four) hours as needed (cough/congestion/fever).    [provider]  etodolac  (LODINE ) 400 MG tablet Take 1 tablet (400 mg total) by mouth 2 (two) times daily. 11/28/22   Hildegard Loge, PA-C  guaifenesin (ROBITUSSIN) 100 MG/5ML syrup Take 200 mg by mouth 3 (three) times daily as needed for cough.    [provider]  lidocaine  (LIDODERM ) 5 % Place 1 patch onto the skin daily. Remove & Discard patch within 12 hours or as directed by MD  11/28/22   Hildegard Loge, PA-C  methocarbamol  (ROBAXIN ) 500 MG tablet Take 1 tablet (500 mg total) by mouth 2 (two) times daily. 11/28/22   Hildegard Loge, PA-C  ondansetron  (ZOFRAN ) 4 MG tablet Take 1 tablet (4 mg total) by mouth every 6 (six) hours. 12/31/23   Ruthell Lonni FALCON, PA-C    Allergies: Patient has no known allergies.    Review of Systems  Gastrointestinal:  Positive for vomiting.    Updated Vital Signs  Vitals:   04/14/24 1853 04/14/24 1854 04/14/24 2230 04/14/24 2300  BP:  (!) 145/80 139/87 (!) 147/78  Pulse:  80 72 72  Resp:  16 18 17   Temp:  98.4 F (36.9 C)  98 F (36.7 C)  TempSrc:  Oral  Oral  SpO2:  98% 98% 98%  Weight: 116.1 kg     Height: 6' 2 (1.88 m)        Physical Exam Vitals and nursing note reviewed.  HENT:     Head: Normocephalic.  Eyes:     Extraocular Movements: Extraocular movements intact.  Cardiovascular:     Rate and Rhythm: Normal rate and regular rhythm.     Heart sounds: Normal heart sounds.  Pulmonary:     Effort: Pulmonary effort is normal.     Breath sounds: Normal breath sounds.  Abdominal:     Palpations: Abdomen is soft.     Tenderness: There is abdominal tenderness. There is no guarding.     Comments:  Mild generalized tenderness to palpation  Musculoskeletal:     Cervical back: Normal range of motion.     Right lower leg: No edema.     Left lower leg: No edema.     Comments: Moves all extremity spontaneously without difficulty  Skin:    General: Skin is warm and dry.  Neurological:     Mental Status: He is alert and oriented to person, place, and time.     (all labs ordered are listed, but only abnormal results are displayed) Labs Reviewed  COMPREHENSIVE METABOLIC PANEL WITH GFR - Abnormal; Notable for the following components:      Result Value   Glucose, Bld 128 (*)    Calcium 10.4 (*)    AST 14 (*)    Anion gap 16 (*)    All other components within normal limits  CBC - Abnormal; Notable for the following  components:   WBC 17.1 (*)    All other components within normal limits  URINALYSIS, ROUTINE W REFLEX MICROSCOPIC - Abnormal; Notable for the following components:   Specific Gravity, Urine 1.034 (*)    pH 8.5 (*)    Hgb urine dipstick TRACE (*)    Ketones, ur 40 (*)    Protein, ur 30 (*)    All other components within normal limits  LIPASE, BLOOD  URINE DRUG SCREEN  GC/CHLAMYDIA PROBE AMP ([place]) NOT AT [facility]    EKG: None  Radiology: CT ABDOMEN PELVIS W CONTRAST Result Date: 04/14/2024 EXAM: CT ABDOMEN AND PELVIS WITH CONTRAST 04/14/2024 10:13:22 PM TECHNIQUE: CT of the abdomen and pelvis was performed with the administration of 100 mL of iohexol  (OMNIPAQUE ) 300 MG/ML solution. Multiplanar reformatted images are provided for review. Automated exposure control, iterative reconstruction, and/or weight-based adjustment of the mA/kV was utilized to reduce the radiation dose to as low as reasonably achievable. COMPARISON: 12/31/2023 CLINICAL HISTORY: Abdominal pain, nausea/vomiting/diarrhea. FINDINGS: LOWER CHEST: No acute abnormality. LIVER: The liver is unremarkable. GALLBLADDER AND BILE DUCTS: Gallbladder is unremarkable. No biliary ductal dilatation. SPLEEN: No acute abnormality. PANCREAS: No acute abnormality. ADRENAL GLANDS: No acute abnormality. KIDNEYS, URETERS AND BLADDER: No stones in the kidneys or ureters. No hydronephrosis. No perinephric or periureteral stranding. Urinary bladder is unremarkable. GI AND BOWEL: Stomach demonstrates no acute abnormality. There is no bowel obstruction. Normal appendix (image 74). PERITONEUM AND RETROPERITONEUM: No ascites. No free air. VASCULATURE: Aorta is normal in caliber. LYMPH NODES: No lymphadenopathy. REPRODUCTIVE ORGANS: The prostate is unremarkable. BONES AND SOFT TISSUES: No acute osseous abnormality. Tiny fat-containing left inguinal hernia. IMPRESSION: 1. No acute findings in the abdomen or pelvis. Electronically signed by: Pinkie Pebbles MD 04/14/2024 10:20 PM EST RP Workstation: [id number]     Procedures   Medications Ordered in the ED  ondansetron  (ZOFRAN -ODT) disintegrating tablet 4 mg (4 mg Oral Given 04/14/24 1856)  sodium chloride  0.9 % bolus 1,000 mL (0 mLs Intravenous Stopped 04/14/24 2310)  morphine  (PF) 4 MG/ML injection 4 mg (4 mg Intravenous Given 04/14/24 2157)  ondansetron  (ZOFRAN ) injection 4 mg (4 mg Intravenous Given 04/14/24 2157)  iohexol  (OMNIPAQUE ) 300 MG/ML solution 100 mL (100 mLs Intravenous Contrast Given 04/14/24 2213)                                    Medical Decision Making This patient presents to the ED for concern of abdominal pain/nausea/vomiting/diarrhea, this involves an extensive number of treatment options, and  is a complaint that carries with it a high risk of complications and morbidity.  The differential diagnosis includes gastroenteritis, pancreatitis, PUD, diverticulitis, IBD  Additional history obtained:  Additional history obtained from record review External records from outside source obtained and reviewed including prior ED note   Lab Tests:  I Ordered, and personally interpreted labs.  The pertinent results include: CBC notable for leukocytosis with white blood cell count of 17.1, otherwise unremarkable.  CMP notable for anion gap of 16, otherwise unremarkable.  Lipase within normal limits.  Urinalysis notable for trace RBCs with ketones/protein, high specific gravity suggestive of dehydration.   Imaging Studies ordered:  I ordered imaging studies including CT abdomen/pelvis  I independently visualized and interpreted imaging which showed 1. No acute findings in the abdomen or pelvis.  I agree with the radiologist interpretation   Cardiac Monitoring: / EKG:  The patient was maintained on a cardiac monitor.  I personally viewed and interpreted the cardiac monitored which showed an underlying rhythm of: NSR   Problem List / ED Course / Critical  interventions / Medication management  I ordered medication including morphine  for pain, Zofran  for nausea, IV fluid bolus for rehydration  Reevaluation of the patient after these medicines showed that the patient improved I have reviewed the patients home medicines and have made adjustments as needed   Social Determinants of Health:  Housing instability, financial instability   Test / Admission - Considered:  Physical exam is largely unremarkable as above, patient does have some mild generalized tenderness to palpation of the abdomen but no focal/point tenderness, given recurrent episodes of vomiting/diarrhea today with associated leukocytosis, will proceed with CT abdomen/pelvis to further evaluate. No acute findings on imaging, at time my reassessment patient reports that he is feeling much better, has not had any recurrence of nausea/vomiting/diarrhea, he was able to p.o. challenge without difficulty.  I suspect the patient's symptoms today are likely secondary to a viral gastroenteritis, vitals are reassuring as he is without fever or tachycardia, this is the second time patient has had similar symptoms in the past 6 months, at time of last emergency department visit it was suspected that his symptoms may be due to to cannabinoid hyperemesis, however he denies any recent alcohol/substance use, UDS was ordered but was unfortunately not collected/resulted prior to discharge.  Patient did complain of 1 bout of dysuria, no obvious UTI but will send for gonorrhea/chlamydia probe as well as urine culture.   I will provide the patient with the contact information for a gastroenterologist to schedule follow-up in the event that his symptoms persist.  Will prescribe Zofran  to be used as needed for nausea.  Return precautions discussed, patient voiced understanding is in agreement this plan.  He is appropriate for discharge at this time.    Amount and/or Complexity of Data Reviewed Labs:  ordered. Radiology: ordered.  Risk Prescription drug management.        Final diagnoses:  Nausea and vomiting, unspecified vomiting type    ED Discharge Orders          Ordered    ondansetron  (ZOFRAN -ODT) 4 MG disintegrating tablet  Every 8 hours PRN        04/14/24 2316               Glendia Rocky SAILOR, PA-C 04/15/24 0022    Kingsley, Victoria K, DO 04/15/24 1504

## 2024-04-14 NOTE — ED Provider Notes (Incomplete)
 [place] [facility]MENT AT [facility] Provider Note   CSN: [id number] Arrival date & time: 04/14/24  1839     Patient presents with: Emesis   Noah Navarro is a 33 y.o. male.  {Add pertinent medical, surgical, social history, OB history to HPI:3434} 33 year old male presenting with abdominal pain, nausea/vomiting/diarrhea.  Patient notes that symptoms began shortly after he woke up this morning, he had 1 normal bowel movement but then has had additional episodes of diarrhea today, he notes at home this morning he began to have the sweats, no known or documented fever at home.  He has had many episodes of vomiting today, no hematemesis.  Reports generalized abdominal pain that seems to wax and wane, pain is mild at this point.  He is on Wegovy but has not had this medication in 1 week, he was due for a repeat dose today but did not take it due to his symptoms.  Reports mild dysuria, no hematuria.  He had similar symptoms in July of this year, he was never given a definitive cause for his symptoms.  No history of prior abdominal surgeries.  No melena/hematochezia, pain with bowel movements, chest pain or shortness of breath.   Emesis      Prior to Admission medications   Medication Sig Start Date End Date Taking? Authorizing Provider  Chlorphen-Phenyleph-Ibuprofen (ADVIL MULTI-SYMPTOM COLD & FLU PO) Take 1 tablet by mouth every 4 (four) hours as needed (cough/congestion/fever).    [provider]  Chlorphen-Pseudoephed-APAP (THERAFLU FLU/COLD PO) Take 1 packet by mouth every 4 (four) hours as needed (cough/congestion/fever).    [provider]  etodolac  (LODINE ) 400 MG tablet Take 1 tablet (400 mg total) by mouth 2 (two) times daily. 11/28/22   Hildegard Loge, PA-C  guaifenesin (ROBITUSSIN) 100 MG/5ML syrup Take 200 mg by mouth 3 (three) times daily as needed for cough.    [provider]  lidocaine  (LIDODERM ) 5 % Place 1 patch onto the  skin daily. Remove & Discard patch within 12 hours or as directed by MD 11/28/22   Hildegard Loge, PA-C  methocarbamol  (ROBAXIN ) 500 MG tablet Take 1 tablet (500 mg total) by mouth 2 (two) times daily. 11/28/22   Hildegard, Amjad, PA-C  ondansetron  (ZOFRAN ) 4 MG tablet Take 1 tablet (4 mg total) by mouth every 6 (six) hours. 12/31/23   Ruthell Lonni FALCON, PA-C    Allergies: Patient has no known allergies.    Review of Systems  Gastrointestinal:  Positive for vomiting.    Updated Vital Signs BP (!) 145/80   Pulse 80   Temp 98.4 F (36.9 C) (Oral)   Resp 16   Ht 6' 2 (1.88 m)   Wt 116.1 kg   SpO2 98%   BMI 32.87 kg/m   Physical Exam Vitals and nursing note reviewed.  HENT:     Head: Normocephalic.  Eyes:     Extraocular Movements: Extraocular movements intact.  Cardiovascular:     Rate and Rhythm: Normal rate and regular rhythm.     Heart sounds: Normal heart sounds.  Pulmonary:     Effort: Pulmonary effort is normal.     Breath sounds: Normal breath sounds.  Abdominal:     Palpations: Abdomen is soft.     Tenderness: There is abdominal tenderness. There is no guarding.     Comments: Mild generalized tenderness to palpation  Musculoskeletal:     Cervical back: Normal range of motion.     Right lower leg: No  edema.     Left lower leg: No edema.     Comments: Moves all extremity spontaneously without difficulty  Skin:    General: Skin is warm and dry.  Neurological:     Mental Status: He is alert and oriented to person, place, and time.     (all labs ordered are listed, but only abnormal results are displayed) Labs Reviewed  COMPREHENSIVE METABOLIC PANEL WITH GFR - Abnormal; Notable for the following components:      Result Value   Glucose, Bld 128 (*)    Calcium 10.4 (*)    AST 14 (*)    Anion gap 16 (*)    All other components within normal limits  CBC - Abnormal; Notable for the following components:   WBC 17.1 (*)    All other components within normal limits   URINALYSIS, ROUTINE W REFLEX MICROSCOPIC - Abnormal; Notable for the following components:   Specific Gravity, Urine 1.034 (*)    pH 8.5 (*)    Hgb urine dipstick TRACE (*)    Ketones, ur 40 (*)    Protein, ur 30 (*)    All other components within normal limits  LIPASE, BLOOD    EKG: None  Radiology: No results found.  {Document cardiac monitor, telemetry assessment procedure when appropriate:32947} Procedures   Medications Ordered in the ED  ondansetron  (ZOFRAN -ODT) disintegrating tablet 4 mg (4 mg Oral Given 04/14/24 1856)      {Click here for ABCD2, HEART and other calculators REFRESH Note before signing:1}                              Medical Decision Making This patient presents to the ED for concern of ***, this involves an extensive number of treatment options, and is a complaint that carries with it a high risk of complications and morbidity.  The differential diagnosis includes ***   Co morbidities that complicate the patient evaluation  ***   Additional history obtained:  Additional history obtained from *** External records from outside source obtained and reviewed including ***   Lab Tests:  I Ordered, and personally interpreted labs.  The pertinent results include: CBC notable for leukocytosis with white blood cell count of 17.1, otherwise unremarkable.  CMP notable for anion gap of 16, otherwise unremarkable.  Lipase within normal limits.  Urinalysis notable for trace RBCs with ketones/protein, high specific gravity suggestive of dehydration.   Imaging Studies ordered:  I ordered imaging studies including CT abdomen/pelvis  I independently visualized and interpreted imaging which showed 1. No acute findings in the abdomen or pelvis.  I agree with the radiologist interpretation   Cardiac Monitoring: / EKG:  The patient was maintained on a cardiac monitor.  I personally viewed and interpreted the cardiac monitored which showed an underlying rhythm  of: ***   Consultations Obtained:  I requested consultation with the ***,  and discussed lab and imaging findings as well as pertinent plan - they recommend: ***   Problem List / ED Course / Critical interventions / Medication management  *** I ordered medication including ***  for ***  Reevaluation of the patient after these medicines showed that the patient {resolved/improved/worsened:23923::improved} I have reviewed the patients home medicines and have made adjustments as needed   Social Determinants of Health:  ***   Test / Admission - Considered:  ***    Amount and/or Complexity of Data Reviewed Labs: ordered. Radiology: ordered.  Risk Prescription drug management.   ***  {Document critical care time when appropriate  Document review of labs and clinical decision tools ie CHADS2VASC2, etc  Document your independent review of radiology images and any outside records  Document your discussion with family members, caretakers and with consultants  Document social determinants of health affecting pt's care  Document your decision making why or why not admission, treatments were needed:32947:::1}   Final diagnoses:  None    ED Discharge Orders     None

## 2024-04-14 NOTE — ED Triage Notes (Signed)
 Pt POV reporting abd pain and n/v/d x1 day.

## 2024-04-14 NOTE — Discharge Instructions (Signed)
 Your symptoms today may be secondary to a viral gastroenteritis.  Start Zofran , 1 tablet by mouth every 8 hours as needed for nausea/vomiting.  Continue to push fluids to avoid dehydration.  I will provide you with the contact information for a gastroenterologist, please contact their office to schedule follow-up as needed.  Return to the emergency department if your symptoms worsen.

## 2024-04-17 LAB — GC/CHLAMYDIA PROBE AMP (~~LOC~~) NOT AT ARMC
Chlamydia: NEGATIVE
Comment: NEGATIVE
Comment: NORMAL
Neisseria Gonorrhea: NEGATIVE

## 2024-06-30 ENCOUNTER — Other Ambulatory Visit: Payer: Self-pay | Admitting: General Surgery

## 2024-06-30 DIAGNOSIS — N62 Hypertrophy of breast: Secondary | ICD-10-CM

## 2024-07-25 ENCOUNTER — Other Ambulatory Visit

## 2024-07-25 ENCOUNTER — Encounter
